# Patient Record
Sex: Male | Born: 1945 | Race: White | Hispanic: No | Marital: Married | Smoking: Never smoker
Health system: Southern US, Community
[De-identification: ages and names within clinical notes are randomized; demographics above are authoritative.]

## PROBLEM LIST (undated history)

## (undated) DIAGNOSIS — G2581 Restless legs syndrome: Secondary | ICD-10-CM

## (undated) DIAGNOSIS — L570 Actinic keratosis: Secondary | ICD-10-CM

## (undated) DIAGNOSIS — D049 Carcinoma in situ of skin, unspecified: Secondary | ICD-10-CM

## (undated) DIAGNOSIS — G629 Polyneuropathy, unspecified: Secondary | ICD-10-CM

## (undated) DIAGNOSIS — T753XXA Motion sickness, initial encounter: Secondary | ICD-10-CM

## (undated) HISTORY — DX: Carcinoma in situ of skin, unspecified: D04.9

## (undated) HISTORY — DX: Motion sickness, initial encounter: T75.3XXA

## (undated) HISTORY — DX: Actinic keratosis: L57.0

## (undated) HISTORY — PX: SKIN BIOPSY: SHX1

## (undated) HISTORY — DX: Polyneuropathy, unspecified: G62.9

## (undated) HISTORY — PX: SPINAL CORD STIMULATOR TRIAL: SHX5380

## (undated) HISTORY — PX: ARTHRODESIS: SHX136

## (undated) HISTORY — DX: Restless legs syndrome: G25.81

---

## 2014-06-11 DIAGNOSIS — Z872 Personal history of diseases of the skin and subcutaneous tissue: Secondary | ICD-10-CM | POA: Insufficient documentation

## 2014-06-11 DIAGNOSIS — L578 Other skin changes due to chronic exposure to nonionizing radiation: Secondary | ICD-10-CM | POA: Insufficient documentation

## 2014-07-03 DIAGNOSIS — Z85828 Personal history of other malignant neoplasm of skin: Secondary | ICD-10-CM | POA: Insufficient documentation

## 2016-10-14 DIAGNOSIS — G609 Hereditary and idiopathic neuropathy, unspecified: Secondary | ICD-10-CM | POA: Insufficient documentation

## 2017-05-18 DIAGNOSIS — M4802 Spinal stenosis, cervical region: Secondary | ICD-10-CM | POA: Insufficient documentation

## 2017-05-18 DIAGNOSIS — Z981 Arthrodesis status: Secondary | ICD-10-CM | POA: Insufficient documentation

## 2017-11-14 DIAGNOSIS — M25471 Effusion, right ankle: Secondary | ICD-10-CM | POA: Insufficient documentation

## 2017-11-14 DIAGNOSIS — M25472 Effusion, left ankle: Secondary | ICD-10-CM | POA: Insufficient documentation

## 2021-09-21 ENCOUNTER — Other Ambulatory Visit: Payer: Self-pay | Admitting: Internal Medicine

## 2021-09-21 DIAGNOSIS — R609 Edema, unspecified: Secondary | ICD-10-CM

## 2022-03-08 ENCOUNTER — Other Ambulatory Visit: Payer: Self-pay | Admitting: Physician Assistant

## 2022-03-08 DIAGNOSIS — G5793 Unspecified mononeuropathy of bilateral lower limbs: Secondary | ICD-10-CM

## 2022-03-18 ENCOUNTER — Ambulatory Visit
Admission: RE | Admit: 2022-03-18 | Discharge: 2022-03-18 | Disposition: A | Discharge status: Home / Self Care | Payer: Medicare PPO | Source: Ambulatory Visit | Attending: Physician Assistant | Admitting: Physician Assistant

## 2022-03-18 DIAGNOSIS — G5793 Unspecified mononeuropathy of bilateral lower limbs: Secondary | ICD-10-CM | POA: Diagnosis not present

## 2022-03-25 ENCOUNTER — Encounter (INDEPENDENT_AMBULATORY_CARE_PROVIDER_SITE_OTHER): Payer: Self-pay | Admitting: Vascular Surgery

## 2022-03-25 ENCOUNTER — Ambulatory Visit (INDEPENDENT_AMBULATORY_CARE_PROVIDER_SITE_OTHER): Payer: Medicare PPO | Admitting: Vascular Surgery

## 2022-03-25 VITALS — BP 144/77 | HR 73 | Resp 17 | Ht 70.0 in | Wt 197.0 lb

## 2022-03-25 DIAGNOSIS — G2581 Restless legs syndrome: Secondary | ICD-10-CM | POA: Insufficient documentation

## 2022-03-25 DIAGNOSIS — G609 Hereditary and idiopathic neuropathy, unspecified: Secondary | ICD-10-CM

## 2022-03-25 DIAGNOSIS — M7989 Other specified soft tissue disorders: Secondary | ICD-10-CM | POA: Diagnosis not present

## 2022-03-25 NOTE — Patient Instructions (Signed)
Edema  Edema is an abnormal buildup of fluids in the body tissues and under the skin. Swelling of the legs, feet, and ankles is a common symptom that becomes more likely as you get older. Swelling is also common in looser tissues, such as around the eyes. Pressing on the area may make a temporary dent in your skin (pitting edema). This fluid may also accumulate in your lungs (pulmonary edema). There are many possible causes of edema. Eating too much salt (sodium) and being on your feet or sitting for a long time can cause edema in your legs, feet, and ankles. Common causes of edema include: Certain medical conditions, such as heart failure, liver or kidney disease, and cancer. Weak leg blood vessels. An injury. Pregnancy. Medicines. Being obese. Low protein levels in the blood. Hot weather may make edema worse. Edema is usually painless. Your skin may look swollen or shiny. Follow these instructions at home: Medicines Take over-the-counter and prescription medicines only as told by your health care provider. Your health care provider may prescribe a medicine to help your body get rid of extra water (diuretic). Take this medicine if you are told to take it. Eating and drinking Eat a low-salt (low-sodium) diet to reduce fluid as told by your health care provider. Sometimes, eating less salt may reduce swelling. Depending on the cause of your swelling, you may need to limit how much fluid you drink (fluid restriction). General instructions Raise (elevate) the injured area above the level of your heart while you are sitting or lying down. Do not sit still or stand for long periods of time. Do not wear tight clothing. Do not wear garters on your upper legs. Exercise your legs to get your circulation going. This helps to move the fluid back into your blood vessels, and it may help the swelling go down. Wear compression stockings as told by your health care provider. These stockings help to prevent  blood clots and reduce swelling in your legs. It is important that these are the correct size. These stockings should be prescribed by your health care provider to prevent possible injuries. If elastic bandages or wraps are recommended, use them as told by your health care provider. Contact a health care provider if: Your edema does not get better with treatment. You have heart, liver, or kidney disease and have symptoms of edema. You have sudden and unexplained weight gain. Get help right away if: You develop shortness of breath or chest pain. You cannot breathe when you lie down. You develop pain, redness, or warmth in the swollen areas. You have heart, liver, or kidney disease and suddenly get edema. You have a fever and your symptoms suddenly get worse. These symptoms may be an emergency. Get help right away. Call 911. Do not wait to see if the symptoms will go away. Do not drive yourself to the hospital. Summary Edema is an abnormal buildup of fluids in the body tissues and under the skin. Eating too much salt (sodium)and being on your feet or sitting for a long time can cause edema in your legs, feet, and ankles. Raise (elevate) the injured area above the level of your heart while you are sitting or lying down. Follow your health care provider's instructions about diet and how much fluid you can drink. This information is not intended to replace advice given to you by your health care provider. Make sure you discuss any questions you have with your health care provider. Document Revised: 07/05/2021 Document   Reviewed: 07/05/2021 Elsevier Patient Education  2023 [employer] Inc.  

## 2022-03-25 NOTE — Progress Notes (Signed)
? ? ?Patient ID: Glenn Hamilton, male   DOB: 08/06/1946, 76 y.o.   MRN: [id number] ? ?Chief Complaint  ?Patient presents with  ? New Patient (Initial Visit)  ?  Consult bilateral edema  ? ? ?HPI ?Glenn Hamilton is a 76 y.o. male.  I am asked to see the patient by Dr. Lovie Macadamia for evaluation of bilateral lower extremity edema.  The patient has been having swelling for couple of years.  It started bilaterally in the right leg but now affects both legs and roughly equally.  No open wounds or infection.  No fevers or chills.  No previous history of DVT or superficial thrombophlebitis to his knowledge.  He denies any trauma, injury, or inciting event that started the symptoms.  Elevating them helps somewhat.  He has pretty significant peripheral neuropathy and has difficulty wearing compression socks due to the pressure on his legs being very uncomfortable.  He does walk but it hurts when he walks and his activity has diminished over the years.   ? ? ?Past Medical History:  ?Diagnosis Date  ? Actinic keratosis   ? Motion sickness   ? Neuropathy   ? Restless legs syndrome   ? Squamous cell carcinoma in situ (SCCIS) of skin   ? ? ? ? ? ?Family History  ?Problem Relation Age of Onset  ? Neuropathy Mother   ? Cancer Father   ? Cancer Paternal Uncle   ? ? ? ? ?Social History  ? ?Tobacco Use  ? Smoking status: Never  ? Smokeless tobacco: Never  ?Married. ?No alcohol or drug abuse ? ?Not on File ? ?Current Outpatient Medications  ?Medication Sig Dispense Refill  ? rOPINIRole (REQUIP) 1 MG tablet Take 1 tablet by mouth at bedtime.    ? ?No current facility-administered medications for this visit.  ? ? ? ? ?REVIEW OF SYSTEMS (Negative unless checked) ? ?Constitutional: [] Weight loss  [] Fever  [] Chills ?Cardiac: [] Chest pain   [] Chest pressure   [] Palpitations   [] Shortness of breath when laying flat   [] Shortness of breath at rest   [] Shortness of breath with exertion. ?Vascular:  [] Pain in legs with walking    [] Pain in legs at rest   [] Pain in legs when laying flat   [] Claudication   [] Pain in feet when walking  [] Pain in feet at rest  [] Pain in feet when laying flat   [] History of DVT   [] Phlebitis   [x] Swelling in legs   [] Varicose veins   [] Non-healing ulcers ?Pulmonary:   [] Uses home oxygen   [] Productive cough   [] Hemoptysis   [] Wheeze  [] COPD   [] Asthma ?Neurologic:  [] Dizziness  [] Blackouts   [] Seizures   [] History of stroke   [] History of TIA  [] Aphasia   [] Temporary blindness   [] Dysphagia   [] Weakness or numbness in arms   [] Weakness or numbness in legs ?Musculoskeletal:  [x] Arthritis   [] Joint swelling   [] Joint pain   [] Low back pain ?Hematologic:  [] Easy bruising  [] Easy bleeding   [] Hypercoagulable state   [] Anemic  [] Hepatitis ?Gastrointestinal:  [] Blood in stool   [] Vomiting blood  [] Gastroesophageal reflux/heartburn   [] Abdominal pain ?Genitourinary:  [] Chronic kidney disease   [] Difficult urination  [] Frequent urination  [] Burning with urination   [] Hematuria ?Skin:  [] Rashes   [] Ulcers   [] Wounds ?Psychological:  [] History of anxiety   []  History of major depression. ? ? ? ?Physical Exam ?BP (!) 144/77 (BP Location: Left Arm)   Pulse 73  Resp 17   Ht 5\' 10"  (1.778 m)   Wt 197 lb (89.4 kg)   BMI 28.27 kg/m?  ?Gen:  WD/WN, NAD.  Appears younger than stated age ?Head: [place]/AT, No temporalis wasting.  ?Ear/Nose/Throat: Hearing grossly intact, nares w/o erythema or drainage, oropharynx w/o Erythema/Exudate ?Eyes: Conjunctiva clear, sclera non-icteric  ?Neck: trachea midline.  No JVD.  ?Pulmonary:  Good air movement, respirations not labored, no use of accessory muscles  ?Cardiac: RRR, no JVD ?Vascular:  ?Vessel Right Left  ?Radial Palpable Palpable  ?    ?    ?    ?    ?    ?    ?DP 1+ 1+  ?PT 1+ 1+  ? ?Gastrointestinal:. No masses, surgical incisions, or scars. ?Musculoskeletal: M/S 5/5 throughout.  Extremities without ischemic changes.  No deformity or atrophy.  1-2+ bilateral lower extremity  edema. ?Neurologic: Sensation grossly intact in extremities.  Symmetrical.  Speech is fluent. Motor exam as listed above. ?Psychiatric: Judgment intact, Mood & affect appropriate for pt's clinical situation. ?Dermatologic: No rashes or ulcers noted.  No cellulitis or open wounds. ? ? ? ?Radiology ?MR THORACIC SPINE WO CONTRAST ? ?Result Date: 03/18/2022 ?CLINICAL DATA:  Burning sensation in both legs, pre spinal stimulator imaging EXAM: MRI THORACIC SPINE WITHOUT CONTRAST TECHNIQUE: Multiplanar, multisequence MR imaging of the thoracic spine was performed. No intravenous contrast was administered. COMPARISON:  None Available. FINDINGS: Alignment: Thoracic spine alignment is normal. There is transitional lumbosacral anatomy with sacralization of the L5 vertebral body seen on the scout sequence. There is grade 1 anterolisthesis of L2 on L3. Vertebrae: Postsurgical changes are noted in the cervical spine on the count sequence, incompletely evaluated. Thoracic vertebral body heights are preserved. Background marrow signal is normal. 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 There is no suspicious marrow signal abnormality in the spine. There is trace degenerative endplate marrow signal abnormality with faint edema at T9-T10. There is no other marrow edema. There is a 1.7 cm T2/STIR hyperintense lesion in the sternal manubrium with corresponding mild T1 hypointensity. Cord: The lower cervical cord appears atrophic with decreased AP diameter. The thoracic cord is normal in signal and morphology. Paraspinal and other soft tissues: Unremarkable. Disc levels: There is mild multilevel degenerative endplate change and facet arthropathy. There is disc desiccation and narrowing most advanced at T7-T8 through T9-T10. There is a mild disc bulge at C7-T1 without significant spinal canal or neural foraminal stenosis There are small disc protrusions at T7-T8 and T8-T9 without  significant spinal. There is a mild bulge at T9-T10 extending into the left neural foramen, facet arthropathy, and ligamentum flavum thickening resulting in mild spinal canal stenosis and severe left neural foraminal stenosis. There is no other significant spinal canal or neural foraminal stenosis. IMPRESSION: 1. Disc bulge at T9-T10 extending into the left neural foramen and facet arthropathy resulting in severe left neural foraminal stenosis and mild spinal canal stenosis. 2. Otherwise, mild degenerative changes throughout the remainder of the thoracic spine without other significant spinal canal or neural foraminal stenosis. 3. T2 hyperintense, T1 hypointense lesion in the sternal manubrium. 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 Correlation with patient's prior CT chest from 04/30/2018 (not available for direct comparison at the time of dictation) for typical CT features of an intraosseous hemangioma would  be useful to establish stability. Alternatively, consider follow-up MRI with contrast in 4-6 months. 4. Apparent atrophy of the lower cervical cord, incompletely imaged. Electronically Signed   By: Valetta Mole M.D.   On: 03/18/2022 12:39   ? ?Labs ?No results found for this or any previous visit (from the past 2160 hour(s)). ? ?Assessment/Plan: ? ?Swelling of limb ?Recommend: ? ?I have had a long discussion with the patient regarding swelling and why it  causes symptoms.  Patient will begin wearing graduated compression on a daily basis a prescription was given. The patient will  wear the stockings first thing in the morning and removing them in the evening. The patient is instructed specifically not to sleep in the stockings.  ? ?In addition, behavioral modification will be initiated.  This will include frequent elevation, use of over the counter  pain medications and exercise such as walking. ? ?Consideration for a lymph pump will also be made based upon the effectiveness of conservative therapy.  This would help to improve the edema control and prevent sequela such

## 2022-03-25 NOTE — Assessment & Plan Note (Signed)
His neuropathy has limited his mobility and made it harder for him to wear compression socks as well. ?

## 2022-03-25 NOTE — Assessment & Plan Note (Signed)

## 2022-04-05 ENCOUNTER — Telehealth (INDEPENDENT_AMBULATORY_CARE_PROVIDER_SITE_OTHER): Payer: Self-pay | Admitting: Vascular Surgery

## 2022-04-05 NOTE — Telephone Encounter (Signed)
Patient's wife called and stated patient has an appointment on the 04/26/22 and would like to get an earlier appointment due to his foot turning purple. I looked and there is no earlier ultrasound appointments that I can give him.  Please advise.

## 2022-04-05 NOTE — Telephone Encounter (Signed)
Called to let pt's wife know that Dr Wyn Quaker said that we could wait until the 13th of June for pt's ultrasound as he feels the purple color is coming from the pt's neuropathy. Pt's wife states that multiple neurologist have told them that the swelling was not from neuropathy.  Pt's feet are getting redder and more purple by the day.  She also stated "I guess we wait for his feet to turn darker until they are black and go to the ER".  I did tell her that if the pt had significant changes or other symptoms he should see his PCP or go to the ED.  Wife continues to say that she is not disputing my Dr's word but to her the changes are significant.  I let her know I would pass along the information she has provided and someone would reach back out to her with any changes in recommendations.

## 2022-04-13 ENCOUNTER — Encounter (INDEPENDENT_AMBULATORY_CARE_PROVIDER_SITE_OTHER): Payer: Self-pay | Admitting: Vascular Surgery

## 2022-04-26 ENCOUNTER — Encounter (INDEPENDENT_AMBULATORY_CARE_PROVIDER_SITE_OTHER): Payer: Self-pay | Admitting: Vascular Surgery

## 2022-04-26 ENCOUNTER — Ambulatory Visit (INDEPENDENT_AMBULATORY_CARE_PROVIDER_SITE_OTHER): Payer: Medicare PPO

## 2022-04-26 ENCOUNTER — Ambulatory Visit (INDEPENDENT_AMBULATORY_CARE_PROVIDER_SITE_OTHER): Payer: Medicare PPO | Admitting: Vascular Surgery

## 2022-04-26 VITALS — BP 133/74 | HR 59 | Resp 14 | Ht 70.0 in | Wt 198.0 lb

## 2022-04-26 DIAGNOSIS — G609 Hereditary and idiopathic neuropathy, unspecified: Secondary | ICD-10-CM | POA: Diagnosis not present

## 2022-04-26 DIAGNOSIS — I890 Lymphedema, not elsewhere classified: Secondary | ICD-10-CM | POA: Insufficient documentation

## 2022-04-26 DIAGNOSIS — M7989 Other specified soft tissue disorders: Secondary | ICD-10-CM

## 2022-04-26 NOTE — Progress Notes (Signed)
MRN : [id number]  Glenn Hamilton is a 76 y.o. ([birth year]) male who presents with chief complaint of  Chief Complaint  Patient presents with   Follow-up    ultrasound  .  History of Present Illness: Patient returns today in follow up of his leg swelling.  His symptoms are basically unchanged from his last visit last month.  Both legs remain swollen.  His neuropathy has precluded him from being able to reliably wear compression socks.  A venous reflux study was performed today showing no evidence of deep venous thrombosis, superficial thrombophlebitis, or significant venous reflux in either lower extremity.  Current Outpatient Medications  Medication Sig Dispense Refill   b complex vitamins capsule Take 1 capsule by mouth daily.     rOPINIRole (REQUIP) 1 MG tablet Take 1 tablet by mouth at bedtime.     TURMERIC CURCUMIN PO Take by mouth.     Vitamin E (VITAMIN E/D-ALPHA NATURAL) 268 MG (400 UNIT) CAPS Take by mouth.     No current facility-administered medications for this visit.    Past Medical History:  Diagnosis Date   Actinic keratosis    Motion sickness    Neuropathy    Restless legs syndrome    Squamous cell carcinoma in situ (SCCIS) of skin       Social History   Tobacco Use   Smoking status: Never   Smokeless tobacco: Never      Family History  Problem Relation Age of Onset   Neuropathy Mother    Cancer Father    Cancer Paternal Uncle      Not on File   REVIEW OF SYSTEMS (Negative unless checked)   Constitutional: [] Weight loss  [] Fever  [] Chills Cardiac: [] Chest pain   [] Chest pressure   [] Palpitations   [] Shortness of breath when laying flat   [] Shortness of breath at rest   [] Shortness of breath with exertion. Vascular:  [] Pain in legs with walking   [] Pain in legs at rest   [] Pain in legs when laying flat   [] Claudication   [] Pain in feet when walking  [] Pain in feet at rest  [] Pain in feet when laying flat   [] History of DVT    [] Phlebitis   [x] Swelling in legs   [] Varicose veins   [] Non-healing ulcers Pulmonary:   [] Uses home oxygen   [] Productive cough   [] Hemoptysis   [] Wheeze  [] COPD   [] Asthma Neurologic:  [] Dizziness  [] Blackouts   [] Seizures   [] History of stroke   [] History of TIA  [] Aphasia   [] Temporary blindness   [] Dysphagia   [] Weakness or numbness in arms   [] Weakness or numbness in legs Musculoskeletal:  [x] Arthritis   [] Joint swelling   [] Joint pain   [] Low back pain Hematologic:  [] Easy bruising  [] Easy bleeding   [] Hypercoagulable state   [] Anemic  [] Hepatitis Gastrointestinal:  [] Blood in stool   [] Vomiting blood  [] Gastroesophageal reflux/heartburn   [] Abdominal pain Genitourinary:  [] Chronic kidney disease   [] Difficult urination  [] Frequent urination  [] Burning with urination   [] Hematuria Skin:  [] Rashes   [] Ulcers   [] Wounds Psychological:  [] History of anxiety   []  History of major depression.   Physical Examination  BP 133/74 (BP Location: Left Arm)   Pulse (!) 59   Resp 14   Ht 5\' 10"  (1.778 m)   Wt 198 lb (89.8 kg)   BMI 28.41 kg/m  Gen:  WD/WN, NAD Head: [place]/AT, No temporalis wasting. Ear/Nose/Throat: Hearing grossly intact,  nares w/o erythema or drainage Eyes: Conjunctiva clear. Sclera non-icteric Neck: Supple.  Trachea midline Pulmonary:  Good air movement, no use of accessory muscles.  Cardiac: RRR, no JVD Vascular:  Vessel Right Left  Radial Palpable Palpable           Musculoskeletal: M/S 5/5 throughout.  No deformity or atrophy. 1-2+ BLE edema. Neurologic: Sensation grossly intact in extremities.  Symmetrical.  Speech is fluent.  Psychiatric: Judgment intact, Mood & affect appropriate for pt's clinical situation. Dermatologic: No rashes or ulcers noted.  No cellulitis or open wounds.      Labs No results found for this or any previous visit (from the past 2160 hour(s)).  Radiology No results found.  Assessment/Plan  Lymphedema The patient has stage II  lymphedema with leg swelling refractory to elevation and exercise.  He has tried compression socks but his severe neuropathy will not allow him to wear these reliably.  His venous reflux study was normal.  He has had a negative cardiac evaluation.  He and his wife said they are going to consider being evaluated by nephrologist as well which is reasonable.  I think a lymphedema pump would be a good therapy for him.  He has had something similar to that and it sounds like he was able to tolerate it with his neuropathy so I think we should give that a try.  We will plan on seeing him back in about 6 months to assess his response to therapy.  Hereditary and idiopathic neuropathy, unspecified This keeps him from using compression socks.  May also be contributing to his swelling.    Festus Barren, MD  04/26/2022 11:31 AM    This note was created with Dragon medical transcription system.  Any errors from dictation are purely unintentional

## 2022-04-26 NOTE — Assessment & Plan Note (Signed)
The patient has stage II lymphedema with leg swelling refractory to elevation and exercise.  He has tried compression socks but his severe neuropathy will not allow him to wear these reliably.  His venous reflux study was normal.  He has had a negative cardiac evaluation.  He and his wife said they are going to consider being evaluated by nephrologist as well which is reasonable.  I think a lymphedema pump would be a good therapy for him.  He has had something similar to that and it sounds like he was able to tolerate it with his neuropathy so I think we should give that a try.  We will plan on seeing him back in about 6 months to assess his response to therapy.

## 2022-04-26 NOTE — Assessment & Plan Note (Signed)
This keeps him from using compression socks.  May also be contributing to his swelling.

## 2022-09-12 ENCOUNTER — Encounter (INDEPENDENT_AMBULATORY_CARE_PROVIDER_SITE_OTHER): Payer: Self-pay

## 2023-05-09 ENCOUNTER — Encounter: Payer: Self-pay | Admitting: *Deleted

## 2023-05-16 ENCOUNTER — Ambulatory Visit: Admission: RE | Admit: 2023-05-16 | Payer: Medicare PPO | Source: Home / Self Care

## 2023-05-16 ENCOUNTER — Encounter: Admission: RE | Payer: Self-pay | Source: Home / Self Care

## 2023-05-16 SURGERY — COLONOSCOPY WITH PROPOFOL
Anesthesia: General

## 2023-07-31 ENCOUNTER — Other Ambulatory Visit: Payer: Self-pay

## 2023-08-04 ENCOUNTER — Encounter: Payer: Self-pay | Admitting: *Deleted

## 2023-08-07 ENCOUNTER — Ambulatory Visit
Admission: RE | Admit: 2023-08-07 | Discharge: 2023-08-07 | Disposition: A | Discharge status: Home / Self Care | Payer: Medicare PPO | Attending: Gastroenterology | Admitting: Gastroenterology

## 2023-08-07 ENCOUNTER — Other Ambulatory Visit: Payer: Self-pay

## 2023-08-07 ENCOUNTER — Ambulatory Visit: Payer: Medicare PPO | Admitting: Anesthesiology

## 2023-08-07 ENCOUNTER — Encounter: Payer: Self-pay | Admitting: *Deleted

## 2023-08-07 ENCOUNTER — Encounter
Admission: RE | Disposition: A | Discharge status: Home / Self Care | Payer: Self-pay | Source: Home / Self Care | Attending: Gastroenterology

## 2023-08-07 DIAGNOSIS — D122 Benign neoplasm of ascending colon: Secondary | ICD-10-CM | POA: Diagnosis not present

## 2023-08-07 DIAGNOSIS — Z86010 Personal history of colonic polyps: Secondary | ICD-10-CM | POA: Diagnosis not present

## 2023-08-07 DIAGNOSIS — Z1211 Encounter for screening for malignant neoplasm of colon: Secondary | ICD-10-CM | POA: Insufficient documentation

## 2023-08-07 DIAGNOSIS — K640 First degree hemorrhoids: Secondary | ICD-10-CM | POA: Insufficient documentation

## 2023-08-07 DIAGNOSIS — G629 Polyneuropathy, unspecified: Secondary | ICD-10-CM | POA: Diagnosis not present

## 2023-08-07 HISTORY — PX: POLYPECTOMY: SHX5525

## 2023-08-07 HISTORY — PX: COLONOSCOPY WITH PROPOFOL: SHX5780

## 2023-08-07 SURGERY — COLONOSCOPY WITH PROPOFOL
Anesthesia: General

## 2023-08-07 MED ORDER — SODIUM CHLORIDE 0.9 % IV SOLN: INTRAVENOUS | Status: DC

## 2023-08-07 MED ORDER — PROPOFOL 10 MG/ML IV BOLUS
INTRAVENOUS | Status: DC | PRN
  Administered 2023-08-07: 80 mg via INTRAVENOUS
  Administered 2023-08-07: 20 mg via INTRAVENOUS

## 2023-08-07 MED ORDER — PROPOFOL 1000 MG/100ML IV EMUL
INTRAVENOUS | Status: AC
  Filled 2023-08-07: qty 100

## 2023-08-07 MED ORDER — LIDOCAINE HCL (CARDIAC) PF 100 MG/5ML IV SOSY
PREFILLED_SYRINGE | INTRAVENOUS | Status: DC | PRN
  Administered 2023-08-07: 50 mg via INTRAVENOUS

## 2023-08-07 MED ORDER — PROPOFOL 500 MG/50ML IV EMUL
INTRAVENOUS | Status: DC | PRN
  Administered 2023-08-07: 150 ug/kg/min via INTRAVENOUS

## 2023-08-07 MED ORDER — LIDOCAINE HCL (PF) 2 % IJ SOLN
INTRAMUSCULAR | Status: AC
  Filled 2023-08-07: qty 5

## 2023-08-07 NOTE — Transfer of Care (Signed)
Immediate Anesthesia Transfer of Care Note  Patient: Glenn Hamilton  Procedure(s) Performed: COLONOSCOPY WITH PROPOFOL POLYPECTOMY  Patient Location: PACU and Endoscopy Unit  Anesthesia Type:General  Level of Consciousness: awake  Airway & Oxygen Therapy: Patient Spontanous Breathing  Post-op Assessment: Report given to RN  Post vital signs: stable  Last Vitals:  Vitals Value Taken Time  BP 109/78 08/07/23 1050  Temp    Pulse 74 08/07/23 1050  Resp 13 08/07/23 1050  SpO2 97 % 08/07/23 1050    Last Pain:  Vitals:   08/07/23 1050  TempSrc:   PainSc: Asleep     Past Medical History:  Diagnosis Date   Actinic keratosis    Motion sickness    Neuropathy    Restless legs syndrome    Squamous cell carcinoma in situ (SCCIS) of skin    Past Surgical History:  Procedure Laterality Date   ARTHRODESIS     cervicle spine   SKIN BIOPSY     SKIN BIOPSY     SPINAL CORD STIMULATOR TRIAL     Scheduled Meds: Continuous Infusions:  sodium chloride 20 mL/hr at 08/07/23 1023   PRN Meds:.     Complications: No notable events documented.

## 2023-08-07 NOTE — Interval H&P Note (Signed)
History and Physical Interval Note:  08/07/2023 10:15 AM  Glenn Hamilton  has presented today for surgery, with the diagnosis of h/o polyps.  The various methods of treatment have been discussed with the patient and family. After consideration of risks, benefits and other options for treatment, the patient has consented to  Procedure(s): COLONOSCOPY WITH PROPOFOL (N/A) as a surgical intervention.  The patient's history has been reviewed, patient examined, no change in status, stable for surgery.  I have reviewed the patient's chart and labs.  Questions were answered to the patient's satisfaction.     [name]  Ok to proceed with colonoscopy

## 2023-08-07 NOTE — H&P (Signed)
Outpatient short stay form Pre-procedure 08/07/2023  [name], MD  Primary Physician: [name], MD  Reason for visit:  Surveillance  History of present illness:    77 y/o gentleman with history of peripheral neuropathy and cervical spine surgeries here for colonoscopy due to history of TA on last colonoscopy in 2019. No blood thinners. No family history of GI malignancies. No significant abdominal surgeries.    Current Facility-Administered Medications:    0.9 %  sodium chloride infusion, , Intravenous, Continuous, Lennis Rader, Rossie Muskrat, MD  Medications Prior to Admission  Medication Sig Dispense Refill Last Dose   rOPINIRole (REQUIP) 1 MG tablet Take 1 tablet by mouth at bedtime.   08/06/2023   Alpha-Lipoic Acid 600 MG CAPS Take by mouth 2 (two) times daily. (Patient not taking: Reported on 07/31/2023)   Not Taking   b complex vitamins capsule Take 1 capsule by mouth daily. (Patient not taking: Reported on 07/31/2023)   Not Taking   DULoxetine (CYMBALTA) 30 MG capsule Take 30 mg by mouth daily. (Patient not taking: Reported on 07/31/2023)   Not Taking   TURMERIC CURCUMIN PO Take by mouth. (Patient not taking: Reported on 07/31/2023)   Not Taking   Vitamin E (VITAMIN E/D-ALPHA NATURAL) 268 MG (400 UNIT) CAPS Take by mouth. (Patient not taking: Reported on 07/31/2023)   Not Taking     No Known Allergies   Past Medical History:  Diagnosis Date   Actinic keratosis    Motion sickness    Neuropathy    Restless legs syndrome    Squamous cell carcinoma in situ (SCCIS) of skin     Review of systems:  Otherwise negative.    Physical Exam  Gen: Alert, oriented. Appears stated age.  HEENT: PERRLA. Lungs: No respiratory distress CV: RRR Abd: soft, benign, no masses Ext: No edema    Planned procedures: Proceed with colonoscopy. The patient understands the nature of the planned procedure, indications, risks, alternatives and potential complications including but not  limited to bleeding, infection, perforation, damage to internal organs and possible oversedation/side effects from anesthesia. The patient agrees and gives consent to proceed.  Please refer to procedure notes for findings, recommendations and patient disposition/instructions.     [name], MD [facility]

## 2023-08-07 NOTE — Anesthesia Postprocedure Evaluation (Signed)
Anesthesia Post Note  Patient: Glenn Hamilton  Procedure(s) Performed: COLONOSCOPY WITH PROPOFOL POLYPECTOMY  Patient location during evaluation: PACU Anesthesia Type: General Level of consciousness: awake and alert, oriented and patient cooperative Pain management: pain level controlled Vital Signs Assessment: post-procedure vital signs reviewed and stable Respiratory status: spontaneous breathing, nonlabored ventilation and respiratory function stable Cardiovascular status: blood pressure returned to baseline and stable Postop Assessment: adequate PO intake Anesthetic complications: no   No notable events documented.   Last Vitals:  Vitals:   08/07/23 1100 08/07/23 1112  BP: 121/67 133/76  Pulse: 68 66  Resp: 15 14  Temp:    SpO2: 100% 100%    Last Pain:  Vitals:   08/07/23 1112  TempSrc:   PainSc: 0-No pain                 Reed Breech

## 2023-08-07 NOTE — Op Note (Signed)
[facility] Gastroenterology Patient Name: Glenn Hamilton Procedure Date: 08/07/2023 10:10 AM MRN: [id number] Account #: [id number] Date of Birth: Mar 22, 1946 Admit Type: Outpatient Age: 77 Room: [facility] ENDO ROOM 3 Gender: Male Note Status: Finalized Instrument Name: Nelda Marseille [id number] Procedure:             Colonoscopy Indications:           Surveillance: Personal history of adenomatous polyps                         on last colonoscopy 5 years ago Providers:             Eather Colas MD, MD Referring MD:          Teena Irani. Terance Hart, MD (Referring MD) Medicines:             Monitored Anesthesia Care Complications:         No immediate complications. Estimated blood loss:                         Minimal. Procedure:             Pre-Anesthesia Assessment:                        - Prior to the procedure, a History and Physical was                         performed, and patient medications and allergies were                         reviewed. The patient is competent. The risks and                         benefits of the procedure and the sedation options and                         risks were discussed with the patient. All questions                         were answered and informed consent was obtained.                         Patient identification and proposed procedure were                         verified by the physician, the nurse, the                         anesthesiologist, the anesthetist and the technician                         in the endoscopy suite. Mental Status Examination:                         alert and oriented. Airway Examination: normal                         oropharyngeal airway and neck mobility. Respiratory  Examination: clear to auscultation. CV Examination:                         normal. Prophylactic Antibiotics: The patient does not                         require prophylactic antibiotics. Prior                          Anticoagulants: The patient has taken no anticoagulant                         or antiplatelet agents. ASA Grade Assessment: II - A                         patient with mild systemic disease. After reviewing                         the risks and benefits, the patient was deemed in                         satisfactory condition to undergo the procedure. The                         anesthesia plan was to use monitored anesthesia care                         (MAC). Immediately prior to administration of                         medications, the patient was re-assessed for adequacy                         to receive sedatives. The heart rate, respiratory                         rate, oxygen saturations, blood pressure, adequacy of                         pulmonary ventilation, and response to care were                         monitored throughout the procedure. The physical                         status of the patient was re-assessed after the                         procedure.                        After obtaining informed consent, the colonoscope was                         passed under direct vision. Throughout the procedure,                         the patient's blood pressure, pulse, and oxygen  saturations were monitored continuously. The                         Colonoscope was introduced through the anus and                         advanced to the the cecum, identified by appendiceal                         orifice and ileocecal valve. The colonoscopy was                         somewhat difficult due to significant looping.                         Successful completion of the procedure was aided by                         applying abdominal pressure. The patient tolerated the                         procedure well. The quality of the bowel preparation                         was excellent. The ileocecal valve, appendiceal                         orifice,  and rectum were photographed. Findings:      The perianal and digital rectal examinations were normal.      A 3 mm polyp was found in the ascending colon. The polyp was sessile.       The polyp was removed with a cold snare. Resection and retrieval were       complete. Estimated blood loss was minimal.      Internal hemorrhoids were found during retroflexion. The hemorrhoids       were Grade I (internal hemorrhoids that do not prolapse).      The exam was otherwise without abnormality on direct and retroflexion       views. Impression:            - One 3 mm polyp in the ascending colon, removed with                         a cold snare. Resected and retrieved.                        - Internal hemorrhoids.                        - The examination was otherwise normal on direct and                         retroflexion views. Recommendation:        - Discharge patient to home.                        - Resume previous diet.                        - Continue present  medications.                        - Await pathology results.                        - Repeat colonoscopy is not recommended due to current                         age (68 years or older) for surveillance.                        - Return to referring physician as previously                         scheduled. Procedure Code(s):     --- Professional ---                        [phone], Colonoscopy, flexible; with removal of                         tumor(s), polyp(s), or other lesion(s) by snare                         technique Diagnosis Code(s):     --- Professional ---                        Z86.010, Personal history of colonic polyps                        D12.2, Benign neoplasm of ascending colon                        K64.0, First degree hemorrhoids CPT copyright 2022 [employer]. All rights reserved. The codes documented in this report are preliminary and upon coder review may  be revised to meet current  compliance requirements. Eather Colas MD, MD 08/07/2023 10:48:29 AM Number of Addenda: 0 Note Initiated On: 08/07/2023 10:10 AM Scope Withdrawal Time: 0 hours 7 minutes 39 seconds  Total Procedure Duration: 0 hours 15 minutes 1 second  Estimated Blood Loss:  Estimated blood loss was minimal.      [facility]

## 2023-08-07 NOTE — Anesthesia Preprocedure Evaluation (Addendum)
Anesthesia Evaluation  Patient identified by MRN, date of birth, ID band Patient awake    Reviewed: Allergy & Precautions, NPO status , Patient's Chart, lab work & pertinent test results  History of Anesthesia Complications Negative for: history of anesthetic complications  Airway Mallampati: II   Neck ROM: Full    Dental  (+) Missing   Pulmonary neg pulmonary ROS   Pulmonary exam normal breath sounds clear to auscultation       Cardiovascular Exercise Tolerance: Good negative cardio ROS Normal cardiovascular exam Rhythm:Regular Rate:Normal     Neuro/Psych  Neuromuscular disease (neuropathy)    GI/Hepatic negative GI ROS,,,  Endo/Other  negative endocrine ROS    Renal/GU negative Renal ROS     Musculoskeletal  (+) Arthritis ,  Skin SCC   Abdominal   Peds  Hematology negative hematology ROS (+)   Anesthesia Other Findings   Reproductive/Obstetrics                             Anesthesia Physical Anesthesia Plan  ASA: 2  Anesthesia Plan: General   Post-op Pain Management:    Induction: Intravenous  PONV Risk Score and Plan: 2 and Propofol infusion, TIVA and Treatment may vary due to age or medical condition  Airway Management Planned: Natural Airway  Additional Equipment:   Intra-op Plan:   Post-operative Plan:   Informed Consent: I have reviewed the patients History and Physical, chart, labs and discussed the procedure including the risks, benefits and alternatives for the proposed anesthesia with the patient or authorized representative who has indicated his/her understanding and acceptance.       Plan Discussed with: CRNA  Anesthesia Plan Comments: (LMA/GETA backup discussed.  Patient consented for risks of anesthesia including but not limited to:  - adverse reactions to medications - damage to eyes, teeth, lips or other oral mucosa - nerve damage due to  positioning  - sore throat or hoarseness - damage to heart, brain, nerves, lungs, other parts of body or loss of life  Informed patient about role of CRNA in peri- and intra-operative care.  Patient voiced understanding.)       Anesthesia Quick Evaluation

## 2023-08-08 ENCOUNTER — Encounter: Payer: Self-pay | Admitting: Gastroenterology

## 2023-08-10 LAB — SURGICAL PATHOLOGY

## 2023-12-28 IMAGING — MR MR THORACIC SPINE W/O CM
5 of 6 series · 30 of 48 positions shown · non-contrast
Comparison: None Available.

CLINICAL DATA: Burning sensation in both legs, pre spinal
stimulator imaging

EXAM:
MRI THORACIC SPINE WITHOUT CONTRAST
TECHNIQUE: Multiplanar, multisequence MR imaging of the thoracic spine was
performed. No intravenous contrast was administered.

[Series 18: T1 · sagittal · 6.0mm · 1.41mm/px · 4 of 9 slices shown (1 of 2)]
[im 1/9]
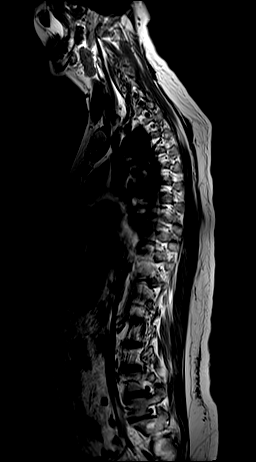
[im 3/9]
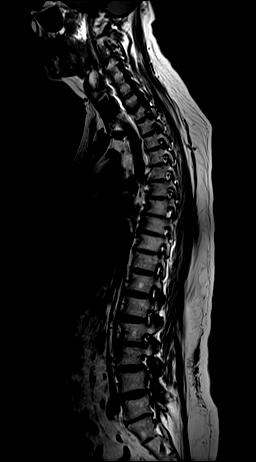
[im 6/9]
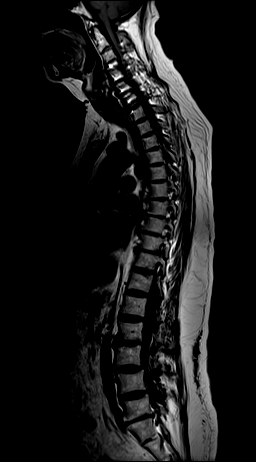
[im 9/9]
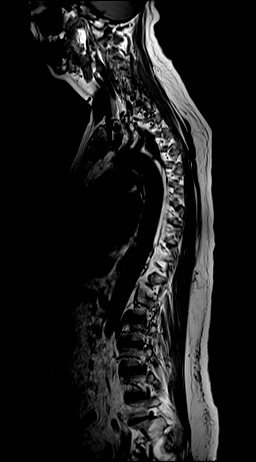

[Series 19: T2 · sagittal · 3.0mm · 1.06mm/px · 6 of 19 slices shown (1 of 2)]
[im 1/19]
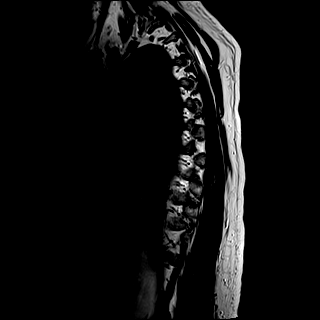
[im 4/19]
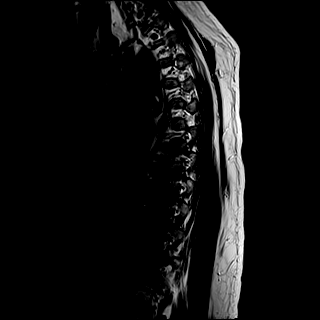
[im 8/19]
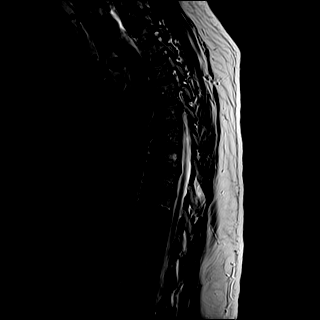
[im 11/19]
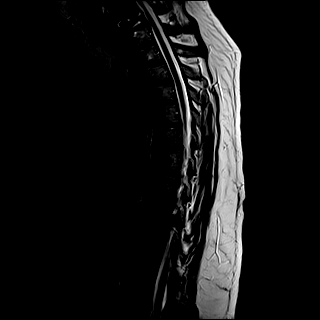
[im 15/19]
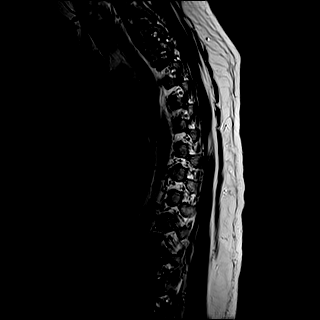
[im 19/19]
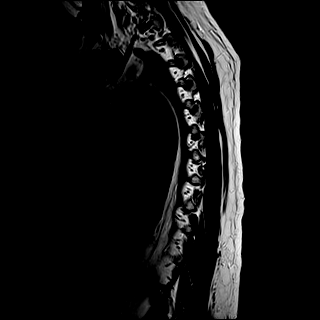

[Series 20: T1 · sagittal · 3.0mm · 1.06mm/px · 6 of 19 slices shown (2 of 2)]
[im 1/19]
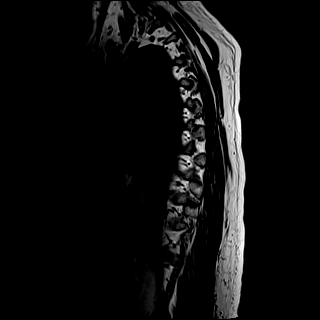
[im 4/19]
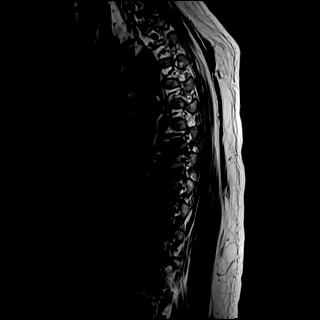
[im 8/19]
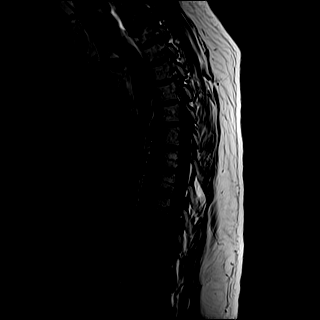
[im 11/19]
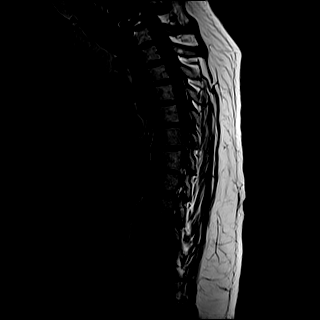
[im 15/19]
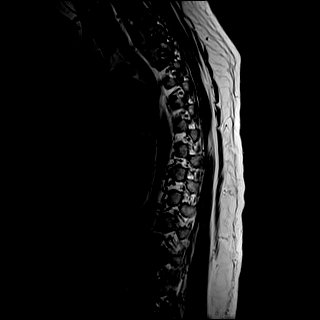
[im 19/19]
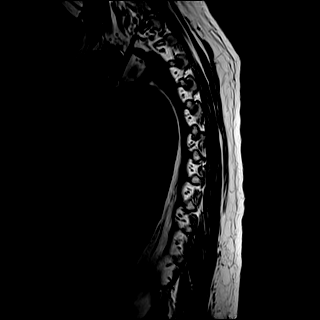

[Series 21: STIR · sagittal · 3.0mm · 0.53mm/px · 5 of 19 slices shown]
[im 1/19]
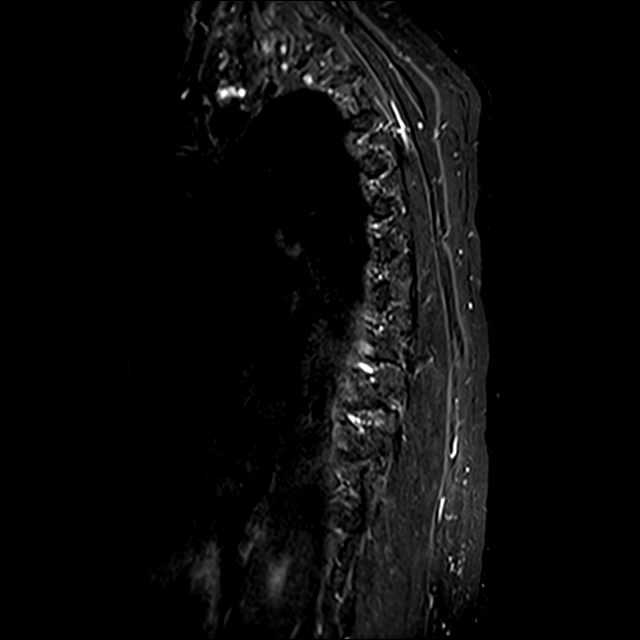
[im 4/19]
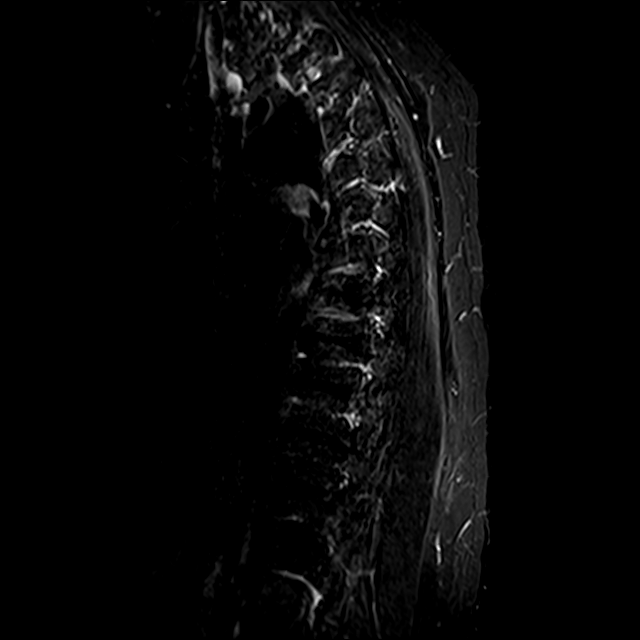
[im 8/19]
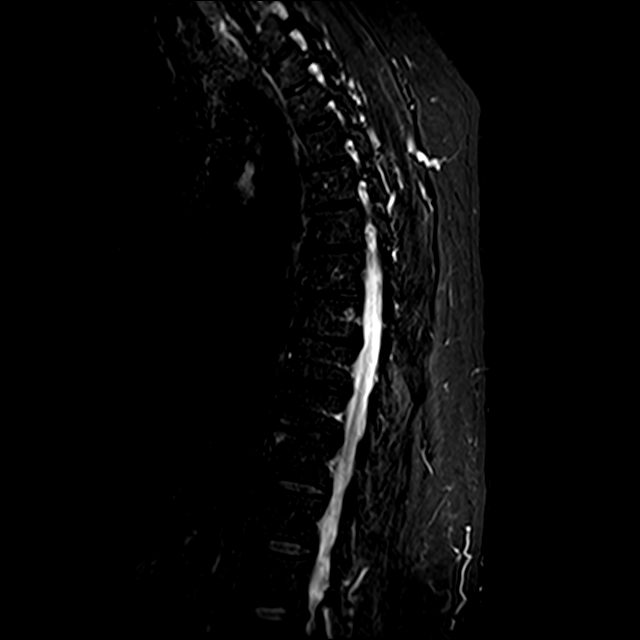
[im 11/19]
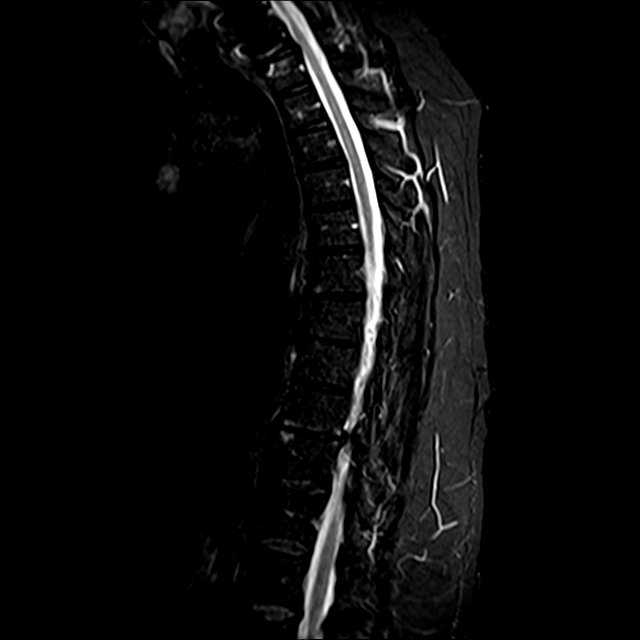
[im 15/19]
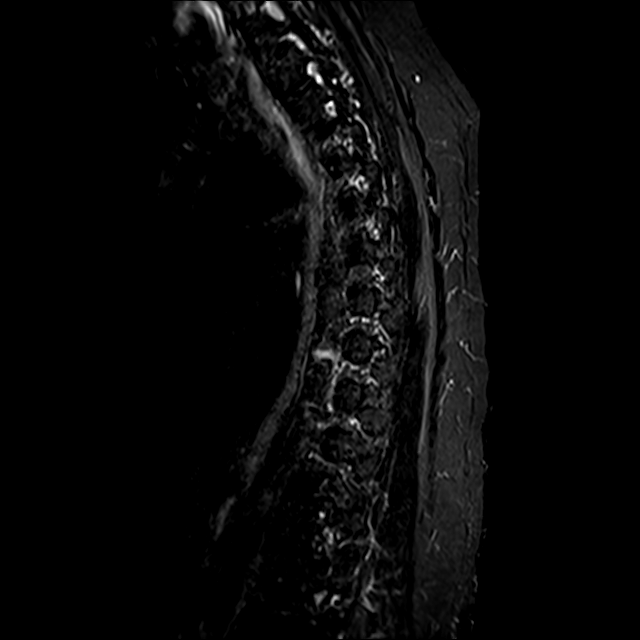

[Series 22: T2 · axial · 4.0mm · 0.59mm/px · z∈[-312,-73]mm · 9 of 39 slices shown (2 of 2)]
[im 1/39]
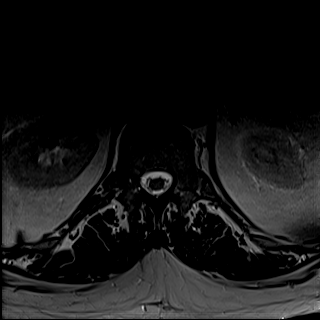
[im 7/39]
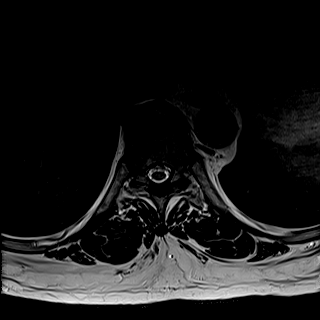
[im 13/39]
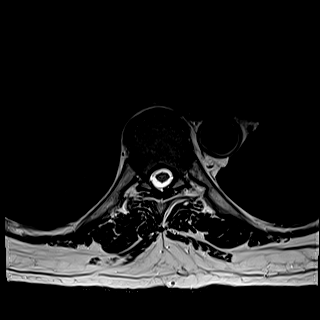
[im 16/39]
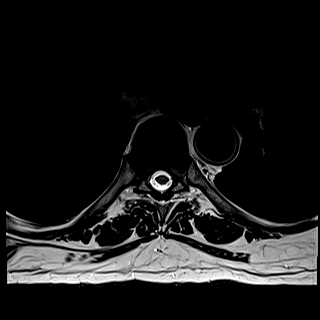
[im 20/39]
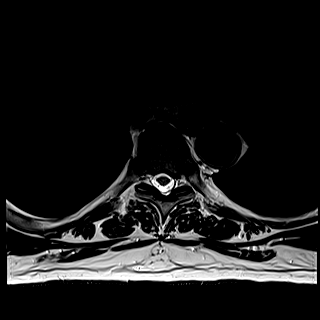
[im 23/39]
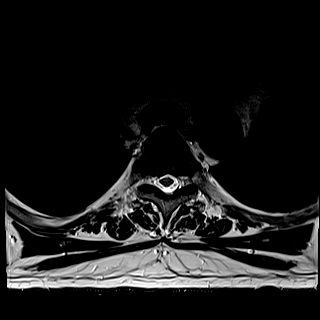
[im 26/39]
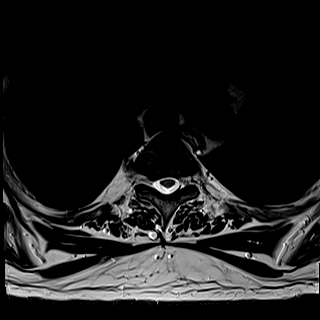
[im 32/39]
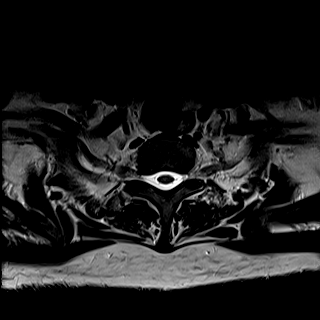
[im 39/39]
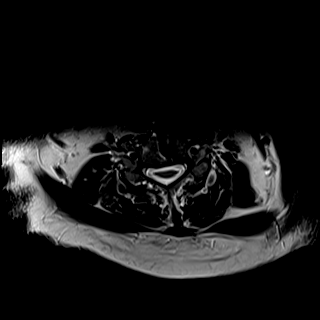

[30 of 48 positions shown; findings below may reference images not displayed]

FINDINGS: Alignment: Thoracic spine alignment is normal. There is transitional
lumbosacral anatomy with sacralization of the L5 vertebral body seen
on the scout sequence. There is grade 1 anterolisthesis of L2 on L3.

Vertebrae: Postsurgical changes are noted in the cervical spine on
the count sequence, incompletely evaluated. Thoracic vertebral body
heights are preserved. Background marrow signal is normal. There are
small probable benign intraosseous hemangiomas in the T7 and T11
vertebral bodies. There is no suspicious marrow signal abnormality
in the spine. There is trace degenerative endplate marrow signal
abnormality with faint edema at T9-T10. There is no other marrow
edema.

There is a 1.7 cm T2/STIR hyperintense lesion in the sternal
manubrium with corresponding mild T1 hypointensity.

Cord: The lower cervical cord appears atrophic with decreased AP
diameter. The thoracic cord is normal in signal and morphology.

Paraspinal and other soft tissues: Unremarkable.

Disc levels:

There is mild multilevel degenerative endplate change and facet
arthropathy. There is disc desiccation and narrowing most advanced
at T7-T8 through T9-T10.

There is a mild disc bulge at C7-T1 without significant spinal canal
or neural foraminal stenosis

There are small disc protrusions at T7-T8 and T8-T9 without
significant spinal.

There is a mild bulge at T9-T10 extending into the left neural
foramen, facet arthropathy, and ligamentum flavum thickening
resulting in mild spinal canal stenosis and severe left neural
foraminal stenosis.

There is no other significant spinal canal or neural foraminal
stenosis.
IMPRESSION: 1. Disc bulge at T9-T10 extending into the left neural foramen and
facet arthropathy resulting in severe left neural foraminal stenosis
and mild spinal canal stenosis.
2. Otherwise, mild degenerative changes throughout the remainder of
the thoracic spine without other significant spinal canal or neural
foraminal stenosis.
3. T2 hyperintense, T1 hypointense lesion in the sternal manubrium.
This is favored to reflect an atypical hemangioma given more typical
appearing hemangiomas in the T7 and T11 vertebral bodies, though a
metastatic lesion cannot be entirely excluded based on this study.
Correlation with patient's prior CT chest from 04/30/2018 (not
available for direct comparison at the time of dictation) for
typical CT features of an intraosseous hemangioma would be useful to
establish stability. Alternatively, consider follow-up MRI with
contrast in 4-6 months.
4. Apparent atrophy of the lower cervical cord, incompletely imaged.

## 2024-08-08 ENCOUNTER — Telehealth (INDEPENDENT_AMBULATORY_CARE_PROVIDER_SITE_OTHER): Payer: Self-pay | Admitting: Vascular Surgery

## 2024-08-08 NOTE — Telephone Encounter (Signed)
 Pt called and states that he is wanting to come in and just get checked out again. Pt was last seen by JD in 04/26/22. Pt states having no new issues and would like to see Dr. Jama

## 2024-09-16 ENCOUNTER — Ambulatory Visit (INDEPENDENT_AMBULATORY_CARE_PROVIDER_SITE_OTHER): Admitting: Vascular Surgery

## 2024-09-16 ENCOUNTER — Encounter (INDEPENDENT_AMBULATORY_CARE_PROVIDER_SITE_OTHER): Payer: Self-pay

## 2024-09-16 DIAGNOSIS — M79669 Pain in unspecified lower leg: Secondary | ICD-10-CM | POA: Insufficient documentation

## 2024-09-16 NOTE — Progress Notes (Deleted)
 MRN : [id number]  Glenn Hamilton is a 78 y.o. ([birth year]) male who presents with chief complaint of legs swell.  History of Present Illness:   The patient returns to the office for followup evaluation regarding leg swelling.  The swelling has persisted and the pain associated with swelling continues. There have not been any interval development of a ulcerations or wounds.  Since the previous visit the patient has been wearing graduated compression stockings and has noted little if any improvement in the lymphedema. The patient has been using compression routinely morning until night.  The patient also states elevation during the day and exercise is being done too.  A previous venous reflux study dated 04/26/2022 showed no evidence of deep venous thrombosis, superficial thrombophlebitis, or significant venous reflux of the deep or superficial venous systems in either lower extremity.   No outpatient medications have been marked as taking for the 09/16/24 encounter (Appointment) with Jama, Cordella MATSU, MD.    Past Medical History:  Diagnosis Date   Actinic keratosis    Motion sickness    Neuropathy    Restless legs syndrome    Squamous cell carcinoma in situ (SCCIS) of skin     Past Surgical History:  Procedure Laterality Date   ARTHRODESIS     cervicle spine   COLONOSCOPY WITH PROPOFOL  N/A 08/07/2023   Procedure: COLONOSCOPY WITH PROPOFOL ;  Surgeon: Maryruth Ole DASEN, MD;  Location: [facility] ENDOSCOPY;  Service: Endoscopy;  Laterality: N/A;   POLYPECTOMY  08/07/2023   Procedure: POLYPECTOMY;  Surgeon: Maryruth Ole DASEN, MD;  Location: [facility] ENDOSCOPY;  Service: Endoscopy;;   SKIN BIOPSY     SKIN BIOPSY     SPINAL CORD STIMULATOR TRIAL      Social History Social History   Tobacco Use   Smoking status: Never   Smokeless tobacco: Never  Vaping Use   Vaping status: Never Used  Substance Use Topics   Alcohol use: Not Currently   Drug  use: Never    Family History Family History  Problem Relation Age of Onset   Neuropathy Mother    Cancer Father    Cancer Paternal Uncle     No Known Allergies   REVIEW OF SYSTEMS (Negative unless checked)  Constitutional: [] Weight loss  [] Fever  [] Chills Cardiac: [] Chest pain   [] Chest pressure   [] Palpitations   [] Shortness of breath when laying flat   [] Shortness of breath with exertion. Vascular:  [] Pain in legs with walking   [x] Pain in legs with standing  [] History of DVT   [] Phlebitis   [x] Swelling in legs   [] Varicose veins   [] Non-healing ulcers Pulmonary:   [] Uses home oxygen   [] Productive cough   [] Hemoptysis   [] Wheeze  [] COPD   [] Asthma Neurologic:  [] Dizziness   [] Seizures   [] History of stroke   [] History of TIA  [] Aphasia   [] Vissual changes   [] Weakness or numbness in arm   [] Weakness or numbness in leg Musculoskeletal:   [] Joint swelling   [] Joint pain   [] Low back pain Hematologic:  [] Easy bruising  [] Easy bleeding   [] Hypercoagulable state   [] Anemic Gastrointestinal:  [] Diarrhea   [] Vomiting  [] Gastroesophageal reflux/heartburn   [] Difficulty swallowing. Genitourinary:  [] Chronic kidney disease   [] Difficult urination  [] Frequent urination   [] Blood in urine Skin:  [] Rashes   []   Ulcers  Psychological:  [] History of anxiety   []  History of major depression.  Physical Examination  There were no vitals filed for this visit. There is no height or weight on file to calculate BMI. Gen: WD/WN, NAD Head: [place]/AT, No temporalis wasting.  Ear/Nose/Throat: Hearing grossly intact, nares w/o erythema or drainage, pinna without lesions Eyes: PER, EOMI, sclera nonicteric.  Neck: Supple, no gross masses.  No JVD.  Pulmonary:  Good air movement, no audible wheezing, no use of accessory muscles.  Cardiac: RRR, precordium not hyperdynamic. Vascular:  scattered varicosities present bilaterally.  Mild venous stasis changes to the legs bilaterally.  3-4+ soft pitting edema, CEAP  [id number]  Vessel Right Left  Radial Palpable Palpable  Gastrointestinal: soft, non-distended. No guarding/no peritoneal signs.  Musculoskeletal: M/S 5/5 throughout.  No deformity.  Neurologic: CN 2-12 intact. Pain and light touch intact in extremities.  Symmetrical.  Speech is fluent. Motor exam as listed above. Psychiatric: Judgment intact, Mood & affect appropriate for pt's clinical situation. Dermatologic: Venous rashes no ulcers noted.  No changes consistent with cellulitis. Lymph : No lichenification or skin changes of chronic lymphedema.  CBC No results found for: WBC, HGB, HCT, MCV, PLT  BMET No results found for: NA, K, CL, CO2, GLUCOSE, BUN, CREATININE, CALCIUM, GFRNONAA, GFRAA CrCl cannot be calculated (No successful lab value found.).  COAG No results found for: INR, PROTIME  Radiology No results found.   Assessment/Plan There are no diagnoses linked to this encounter.   Cordella Shawl, MD  09/16/2024 10:59 AM

## 2024-10-06 ENCOUNTER — Ambulatory Visit
Admission: EM | Admit: 2024-10-06 | Discharge: 2024-10-06 | Disposition: A | Discharge status: Home / Self Care | Attending: Emergency Medicine | Admitting: Emergency Medicine

## 2024-10-06 ENCOUNTER — Encounter: Payer: Self-pay | Admitting: Emergency Medicine

## 2024-10-06 DIAGNOSIS — J069 Acute upper respiratory infection, unspecified: Secondary | ICD-10-CM

## 2024-10-06 MED ORDER — PROMETHAZINE-DM 6.25-15 MG/5ML PO SYRP: 5.0000 mL | ORAL_SOLUTION | Freq: Every evening | ORAL | 0 refills | Status: AC | PRN

## 2024-10-06 MED ORDER — AMOXICILLIN-POT CLAVULANATE 875-125 MG PO TABS: 1.0000 | ORAL_TABLET | Freq: Two times a day (BID) | ORAL | 0 refills | Status: AC

## 2024-10-06 MED ORDER — BENZONATATE 100 MG PO CAPS: 100.0000 mg | ORAL_CAPSULE | Freq: Three times a day (TID) | ORAL | 0 refills | Status: AC

## 2024-10-06 NOTE — Discharge Instructions (Signed)
 Begin Augmentin  twice a day for 7 days  May use tessalon  every 8 hours as needed, may use cough syrup at bedtime to rest    You can take Tylenol and/or Ibuprofen as needed for fever reduction and pain relief.   For cough: honey 1/2 to 1 teaspoon (you can dilute the honey in water or another fluid).  You can also use guaifenesin and dextromethorphan for cough. You can use a humidifier for chest congestion and cough.  If you don't have a humidifier, you can sit in the bathroom with the hot shower running.      For sore throat: try warm salt water gargles, cepacol lozenges, throat spray, warm tea or water with lemon/honey, popsicles or ice, or OTC cold relief medicine for throat discomfort.   For congestion: take a daily anti-histamine like Zyrtec, Claritin, and a oral decongestant, such as pseudoephedrine.  You can also use Flonase 1-2 sprays in each nostril daily.   It is important to stay hydrated: drink plenty of fluids (water, gatorade/powerade/pedialyte, juices, or teas) to keep your throat moisturized and help further relieve irritation/discomfort.

## 2024-10-06 NOTE — ED Triage Notes (Signed)
 Patient reports nasal congestion,  chest congestion and dry cough 7- 10 days. Patient has been taking mucinex with mild relief. Denies pain.

## 2024-10-06 NOTE — ED Provider Notes (Signed)
 Glenn Hamilton    CSN: [id number] Arrival date & time: 10/06/24  1402      History   Chief Complaint Chief Complaint  Patient presents with   Cough   Nasal Congestion    HPI Glenn Hamilton is a 78 y.o. male.   Patient presents for evaluation of nasal congestion and nonproductive cough present for 10 days.  Cough is persistent interfering with sleep.  Has attempted use of Mucinex.  No known sick contacts prior.  Tolerable to food and liquids.  Denies fever, shortness of breath or wheezing.      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   History of skin cancer 07/03/2014   Diffuse photodamage of skin 06/11/2014   History of actinic keratosis 06/11/2014    Past Surgical History:  Procedure Laterality Date   ARTHRODESIS     cervicle spine   COLONOSCOPY WITH PROPOFOL  N/A 08/07/2023   Procedure: COLONOSCOPY WITH PROPOFOL ;  Surgeon: Maryruth Ole DASEN, MD;  Location: [facility];  Service: Endoscopy;  Laterality: N/A;   POLYPECTOMY  08/07/2023   Procedure: POLYPECTOMY;  Surgeon: Maryruth Ole DASEN, MD;  Location: [facility];  Service: Endoscopy;;   SKIN BIOPSY     SKIN BIOPSY     SPINAL CORD STIMULATOR TRIAL         Home Medications    Prior to Admission medications   Medication Sig Start Date End Date Taking? Authorizing Provider  Alpha-Lipoic Acid 600 MG CAPS Take by mouth 2 (two) times daily. Patient not taking: Reported on 07/31/2023    [provider]  b complex vitamins capsule Take 1 capsule by mouth daily. Patient not taking: Reported on 07/31/2023    [provider]  DULoxetine (CYMBALTA) 30 MG capsule Take 30 mg by mouth daily. Patient not taking: Reported on 07/31/2023    [provider]  rOPINIRole (REQUIP) 1 MG tablet Take 1 tablet by mouth at bedtime. 11/14/06   [provider]  TURMERIC CURCUMIN PO Take by mouth. Patient not taking: Reported on 07/31/2023    [provider]  Vitamin E (VITAMIN E/D-ALPHA NATURAL) 268 MG (400 UNIT) CAPS Take by mouth. Patient not taking: Reported on 07/31/2023    [provider]    Family History Family History  Problem Relation Age of Onset   Neuropathy Mother    Cancer Father    Cancer Paternal Uncle     Social History Social History   Tobacco Use   Smoking status: Never   Smokeless tobacco: Never  Vaping Use   Vaping status: Never Used  Substance Use Topics   Alcohol use: Not Currently   Drug use: Never     Allergies   Patient has no known allergies.   Review of Systems Review of Systems  Constitutional: Negative.   HENT:  Positive for congestion. Negative for dental problem, drooling, ear discharge, ear pain, facial swelling, hearing loss, mouth sores, nosebleeds, postnasal drip,  rhinorrhea, sinus pressure, sinus pain, sneezing, sore throat, tinnitus, trouble swallowing and voice change.   Respiratory:  Positive for cough. Negative for apnea, choking, chest tightness, shortness of breath, wheezing and stridor.   Gastrointestinal: Negative.      Physical Exam Triage Vital Signs ED Triage Vitals  Encounter Vitals Group     BP 10/06/24 1522 110/60     Girls Systolic BP Percentile --      Girls Diastolic BP Percentile --      Boys Systolic BP Percentile --      Boys Diastolic BP Percentile --      Pulse Rate 10/06/24 1522 75     Resp 10/06/24 1522 20     Temp 10/06/24 1522 97.8 F (36.6 C)     Temp Source 10/06/24 1522  Oral     SpO2 10/06/24 1522 96 %     Weight --      Height --      Head Circumference --      Peak Flow --      Pain Score 10/06/24 1525 0     Pain Loc --      Pain Education --      Exclude from Growth Chart --    No data found.  Updated Vital Signs BP 110/60 (BP Location: Left Arm)   Pulse 75   Temp 97.8 F (36.6 C) (Oral)   Resp 20   SpO2 96%   Visual Acuity Right Eye Distance:   Left Eye Distance:   Bilateral Distance:    Right Eye Near:   Left Eye Near:    Bilateral Near:     Physical Exam Constitutional:      Appearance: Normal appearance.  HENT:     Right Ear: Tympanic membrane, ear canal and external ear normal.     Left Ear: Tympanic membrane, ear canal and external ear normal.     Nose: Congestion present.     Mouth/Throat:     Pharynx: No oropharyngeal exudate or posterior oropharyngeal erythema.  Cardiovascular:     Rate and Rhythm: Normal rate and regular rhythm.     Pulses: Normal pulses.     Heart sounds: Normal heart sounds.  Pulmonary:     Effort: Pulmonary effort is normal.     Breath sounds: Normal breath sounds.  Musculoskeletal:     Cervical back: Normal range of motion and neck supple.  Neurological:     Mental Status: He is alert and oriented to person, place, and time. Mental status is at baseline.      UC Treatments / Results  Labs (all labs ordered are listed, but only abnormal results are displayed) Labs Reviewed - No data to display  EKG   Radiology No results found.  Procedures Procedures (including critical care time)  Medications Ordered in UC Medications - No data to display  Initial Impression / Assessment and Plan / UC Course  I have reviewed the triage vital signs and the nursing notes.  Pertinent labs & imaging results that were available during my care of the patient were reviewed by me and considered in my medical decision making (see chart for details).  Acute URI  Patient is in no signs of distress  nor toxic appearing.  Vital signs are stable.  Low suspicion for pneumonia, pneumothorax or bronchitis and therefore will defer imaging.  Viral testing deferred due to timeline.  Prescribed Augmentin  and Promethazine  DM and Tessalon .May use additional over-the-counter medications as needed for  supportive care.  May follow-up with urgent care as needed if symptoms persist or worsen.   Final Clinical Impressions(s) / UC Diagnoses   Final diagnoses:  None   Discharge Instructions   None    ED Prescriptions   None    PDMP not reviewed this encounter.   Teresa Shelba SAUNDERS, [place] 10/09/24 [phone]

## 2024-10-17 ENCOUNTER — Ambulatory Visit (INDEPENDENT_AMBULATORY_CARE_PROVIDER_SITE_OTHER): Admitting: Vascular Surgery

## 2024-10-24 NOTE — Progress Notes (Unsigned)
 MRN : [id number]  Glenn Hamilton Glenn Hamilton is a 78 y.o. (09-15-1946) male who presents with chief complaint of legs swell.  History of Present Illness:   The patient returns to the office for followup evaluation regarding leg swelling.  The swelling has persisted and the pain of his legs that seems associated with swelling continues.  He has an extensive history of neuropathy which has been quite severe and seems to worsen as the day goes on.  He also experiences leg pain at night.  He does have a significant history of degenerative spine disease although this has primarily been in the cervical region.  He follows with the [facility].  There have not been any interval development of a ulcerations or wounds.  Since the previous visit the patient has never been able to wear graduated compression stockings secondary to his neuropathy it is just too painful.  The patient has been using compression routinely in the morning but at night he is unable to use compression secondary to the worsening neuropathic symptoms.   The patient also states elevation during the day and exercise is being done too.  Active Medications[1]  Past Medical History:  Diagnosis Date   Actinic keratosis    Motion sickness    Neuropathy    Restless legs syndrome    Squamous cell carcinoma in situ (SCCIS) of skin     Past Surgical History:  Procedure Laterality Date   ARTHRODESIS     cervicle spine   COLONOSCOPY WITH PROPOFOL  N/A 08/07/2023   Procedure: COLONOSCOPY WITH PROPOFOL ;  Surgeon: Maryruth Ole DASEN, MD;  Location: [facility];  Service: Endoscopy;  Laterality: N/A;   POLYPECTOMY  08/07/2023   Procedure: POLYPECTOMY;  Surgeon: Maryruth Ole DASEN, MD;  Location: [facility];  Service: Endoscopy;;   SKIN BIOPSY     SKIN BIOPSY     SPINAL CORD STIMULATOR TRIAL      Social History Social History[2]  Family History Family History  Problem Relation Age of Onset    Neuropathy Mother    Cancer Father    Cancer Paternal Uncle     Allergies[3]   REVIEW OF SYSTEMS (Negative unless checked)  Constitutional: [] Weight loss  [] Fever  [] Chills Cardiac: [] Chest pain   [] Chest pressure   [] Palpitations   [] Shortness of breath when laying flat   [] Shortness of breath with exertion. Vascular:  [] Pain in legs with walking   [x] Pain in legs with standing  [] History of DVT   [] Phlebitis   [x] Swelling in legs   [] Varicose veins   [] Non-healing ulcers Pulmonary:   [] Uses home oxygen   [] Productive cough   [] Hemoptysis   [] Wheeze  [] COPD   [] Asthma Neurologic:  [] Dizziness   [] Seizures   [] History of stroke   [] History of TIA  [] Aphasia   [] Vissual changes   [] Weakness or numbness in arm   [] Weakness or numbness in leg Musculoskeletal:   [] Joint swelling   [] Joint pain   [] Low back pain Hematologic:  [] Easy bruising  [] Easy bleeding   [] Hypercoagulable state   [] Anemic Gastrointestinal:  [] Diarrhea   [] Vomiting  [] Gastroesophageal reflux/heartburn   [] Difficulty swallowing. Genitourinary:  [] Chronic kidney disease   [] Difficult urination  [] Frequent urination   [] Blood in urine Skin:  [] Rashes   [] Ulcers  Psychological:  [] History of anxiety   []   History of major depression.  Physical Examination  There were no vitals filed for this visit. There is no height or weight on file to calculate BMI. Gen: WD/WN, NAD Head: Bright/AT, No temporalis wasting.  Ear/Nose/Throat: Hearing grossly intact, nares w/o erythema or drainage, pinna without lesions Eyes: PER, EOMI, sclera nonicteric.  Neck: Supple, no gross masses.  No JVD.  Pulmonary:  Good air movement, no audible wheezing, no use of accessory muscles.  Cardiac: RRR, precordium not hyperdynamic. Vascular:  scattered varicosities present bilaterally.  Moderate venous stasis changes to the legs bilaterally.  2+ soft pitting edema, CEAP [id number]  Vessel Right Left  Radial Palpable Palpable  Gastrointestinal: soft,  non-distended. No guarding/no peritoneal signs.  Musculoskeletal: M/S 5/5 throughout.  No deformity.  Neurologic: CN 2-12 intact. Pain and light touch intact in extremities.  Symmetrical.  Speech is fluent. Motor exam as listed above. Psychiatric: Judgment intact, Mood & affect appropriate for pt's clinical situation. Dermatologic: Venous rashes no ulcers noted.  No changes consistent with cellulitis. Lymph : No lichenification or skin changes of chronic lymphedema.  CBC No results found for: WBC, HGB, HCT, MCV, PLT  BMET No results found for: NA, K, CL, CO2, GLUCOSE, BUN, CREATININE, CALCIUM, GFRNONAA, GFRAA CrCl cannot be calculated (No successful lab value found.).  COAG No results found for: INR, PROTIME  Radiology No results found.   Assessment/Plan 1. Pain and swelling of lower leg, unspecified laterality Recommend:  The patient has atypical pain symptoms for vascular disease and on exam I do not find evidence of vascular pathology that would explain the patient's symptoms.  Noninvasive studies do not identify significant vascular problems  I suspect the patient is c/o worsening of his neuropathy secondary to his degenerative spinal disease.  Patient should have an evaluation of the LS spine which I defer to the pain management service.  I will place an ambulatory referral  The patient should continue his exercise program. The patient should continue his antiplatelet therapy and aggressive treatment of the lipid abnormalities as needed.  - Ambulatory referral to Pain Clinic  2. Lymphedema (Primary) Recommend:  No surgery or intervention at this point in time.    I have reviewed my discussion with the patient regarding lymphedema and why it  causes symptoms.  Patient will continue wearing graduated compression on a daily basis. The patient should put the compression on first thing in the morning and removing them in the evening. The  patient should not sleep in the compression.   In addition, behavioral modification throughout the day will be continued.  This will include frequent elevation (such as in a recliner), use of over the counter pain medications as needed and exercise such as walking.  The systemic causes for chronic edema such as liver, kidney and cardiac etiologies does not appear to have significant changed over the past year.    The patient will continue aggressive use of the  lymph pump.  This will continue to improve the edema control and prevent sequela such as ulcers and infections.   The patient will follow-up with me on an annual basis.   3. Neuropathy Recommend:  The patient has atypical pain symptoms for vascular disease and on exam I do not find evidence of vascular pathology that would explain the patient's symptoms.  Noninvasive studies do not identify significant vascular problems  I suspect the patient is c/o worsening of his neuropathy secondary to his degenerative spinal disease.  Patient should have an evaluation of the LS  spine which I defer to the pain management service.  I will place an ambulatory referral  The patient should continue his exercise program. The patient should continue his antiplatelet therapy and aggressive treatment of the lipid abnormalities as needed. - Ambulatory referral to Pain Clinic    Cordella Shawl, MD  10/24/2024 12:46 PM      [1]  No outpatient medications have been marked as taking for the 10/28/24 encounter (Appointment) with Shawl, Cordella MATSU, MD.  [2]  Social History Tobacco Use   Smoking status: Never   Smokeless tobacco: Never  Vaping Use   Vaping status: Never Used  Substance Use Topics   Alcohol use: Not Currently   Drug use: Never  [3] No Known Allergies

## 2024-10-28 ENCOUNTER — Ambulatory Visit (INDEPENDENT_AMBULATORY_CARE_PROVIDER_SITE_OTHER): Admitting: Vascular Surgery

## 2024-10-28 ENCOUNTER — Encounter (INDEPENDENT_AMBULATORY_CARE_PROVIDER_SITE_OTHER): Payer: Self-pay | Admitting: Vascular Surgery

## 2024-10-28 VITALS — BP 93/62 | HR 87 | Resp 18 | Ht 69.0 in | Wt 204.8 lb

## 2024-10-28 DIAGNOSIS — G629 Polyneuropathy, unspecified: Secondary | ICD-10-CM | POA: Insufficient documentation

## 2024-10-28 DIAGNOSIS — M79669 Pain in unspecified lower leg: Secondary | ICD-10-CM | POA: Diagnosis not present

## 2024-10-28 DIAGNOSIS — I890 Lymphedema, not elsewhere classified: Secondary | ICD-10-CM

## 2024-10-28 DIAGNOSIS — M7989 Other specified soft tissue disorders: Secondary | ICD-10-CM | POA: Diagnosis not present

## 2024-12-11 ENCOUNTER — Ambulatory Visit
Attending: Student in an Organized Health Care Education/Training Program | Admitting: Student in an Organized Health Care Education/Training Program

## 2024-12-11 ENCOUNTER — Encounter: Payer: Self-pay | Admitting: Student in an Organized Health Care Education/Training Program

## 2024-12-11 VITALS — BP 133/70 | HR 66 | Temp 97.5°F | Resp 16 | Ht 65.0 in | Wt 205.0 lb

## 2024-12-11 DIAGNOSIS — G894 Chronic pain syndrome: Secondary | ICD-10-CM | POA: Diagnosis not present

## 2024-12-11 DIAGNOSIS — R202 Paresthesia of skin: Secondary | ICD-10-CM | POA: Insufficient documentation

## 2024-12-11 DIAGNOSIS — G5793 Unspecified mononeuropathy of bilateral lower limbs: Secondary | ICD-10-CM | POA: Insufficient documentation

## 2024-12-11 NOTE — Progress Notes (Signed)
 PROVIDER NOTE: Interpretation of information contained herein should be left to medically-trained personnel. Specific patient instructions are provided elsewhere under Patient Instructions section of medical record. This document was created in part using AI and STT-dictation technology, any transcriptional errors that may result from this process are unintentional.  Patient: Glenn Hamilton  Service: E/M Encounter  Provider: Wallie Sherry, MD  DOB: 1945/11/17  Delivery: Face-to-face  Specialty: Interventional Pain Management  MRN: [id number]  Setting: Ambulatory outpatient facility  Specialty designation: 09  Type: New Patient  Location: Outpatient office facility  PCP: Glover Lenis, MD  DOS: 12/11/2024    Referring Prov.: Schnier, Cordella MATSU, MD   Primary Reason(s) for Visit: Encounter for initial evaluation of one or more chronic problems (new to examiner) potentially causing chronic pain, and posing a threat to normal musculoskeletal function. (Level of risk: High) CC: Foot Pain (bilateral)  HPI  Mr. Glenn Hamilton is a 79 y.o. year old, male patient, who comes for the first time to our practice referred by Jama, Cordella MATSU, MD for our initial evaluation of his chronic pain. He has Arthrodesis status; Cervical stenosis of spine; Diffuse photodamage of skin; Hereditary and idiopathic neuropathy, unspecified; History of actinic keratosis; History of skin cancer; Restless legs syndrome; Swollen ankles; Swelling of limb; Lymphedema; Pain and swelling of lower leg; Neuropathy; Neuropathy involving both lower extremities; Paresthesia of lower extremity; and Chronic pain syndrome on their problem list. Today he comes in for evaluation of his Foot Pain (bilateral)  Pain Assessment: Location: Right, Left Foot Radiating: both legs Onset: More than a month ago Duration: Neuropathic pain Quality: Burning, Constant Severity: 8 /10 (subjective, self-reported pain score)  Effect on ADL:   Timing:  Constant Modifying factors: cold/ice packs, sitting or lying down BP: 133/70  HR: 66  Onset and Duration: Gradual Cause of pain: 06/2016 Severity: NAS-11 at its worse: 9/10, NAS-11 at its best: 9/10, NAS-11 now: 6/10, and NAS-11 on the average: 9/10 Timing: Afternoon, Night, During activity or exercise, and After activity or exercise Aggravating Factors: Bending, Climbing, Kneeling, Lifiting, Motion, Prolonged standing, and Walking Alleviating Factors: Cold packs, Lying down, Resting, and Sleeping Associated Problems: Fatigue, Swelling, and Weakness Quality of Pain: Burning, Constant, Hot, Punishing, Tiring, and Uncomfortable Previous Examinations or Tests: CT scan, EMG/PNCV, MRI scan, Nerve block, Nerve conduction test, and Neurological evaluation Previous Treatments: Biofeedback, Spinal cord stimulator, and TENS  Mr. Glenn Hamilton is being evaluated for possible interventional pain management therapies for the treatment of his chronic pain.   Discussed the use of AI scribe software for clinical note transcription with the patient, who gave verbal consent to proceed.  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  He has been experiencing chronic burning pain in his feet and legs for the past eight years, diagnosed as idiopathic peripheral neuropathy. The burning sensation is constant, worsening throughout the day and peaking at night. Physical activity exacerbates the pain, preventing him from engaging in activities he once enjoyed, such as biking and walking. He describes the sensation as 'the fire gets hotter, hotter.'  He has tried numerous treatments over the years, including pharmaceuticals: Gabapentin, Lyrica, SNRIs, TCAs and topical creams, but none have provided relief. He specifically mentions trying capsaicin cream, which caused significant burning,  leading him to wash it off quickly. He wears Crocs as he cannot tolerate socks due  to the burning sensation.  He has no history of diabetes, which was confirmed during the initial diagnosis of idiopathic peripheral neuropathy. He participated in a spinal cord stimulator trial at [facility], which was unsuccessful in providing relief, as he was among the 20% of participants who did not benefit.  In addition to neuropathy, he has lymphedema and uses compression sleeves daily for one hour to manage swelling, though this does not alleviate the burning pain. No heart issues and had an EKG six months ago for cataract surgery clearance.       Meds  Current Medications[1]  Imaging Review   MR THORACIC SPINE WO CONTRAST  Narrative CLINICAL DATA:  Burning sensation in both legs, pre spinal stimulator imaging  EXAM: MRI THORACIC SPINE WITHOUT CONTRAST  TECHNIQUE: Multiplanar, multisequence MR imaging of the thoracic spine was performed. No intravenous contrast was administered.  COMPARISON:  None Available.  FINDINGS: Alignment: Thoracic spine alignment is normal. There is transitional lumbosacral anatomy with sacralization of the L5 vertebral body seen on the scout sequence. There is grade 1 anterolisthesis of L2 on L3.  Vertebrae: Postsurgical changes are noted in the cervical spine on the count sequence, incompletely evaluated. Thoracic vertebral body heights are preserved. Background marrow signal is normal. 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 There is no suspicious marrow signal abnormality in the spine. There is trace degenerative endplate marrow signal abnormality with faint edema at T9-T10. There is no other marrow edema.  There is a 1.7 cm T2/STIR hyperintense lesion in the sternal manubrium with corresponding mild T1 hypointensity.  Cord: The lower cervical cord appears atrophic with decreased AP diameter. The thoracic  cord is normal in signal and morphology.  Paraspinal and other soft tissues: Unremarkable.  Disc levels:  There is mild multilevel degenerative endplate change and facet arthropathy. There is disc desiccation and narrowing most advanced at T7-T8 through T9-T10.  There is a mild disc bulge at C7-T1 without significant spinal canal or neural foraminal stenosis  There are small disc protrusions at T7-T8 and T8-T9 without significant spinal.  There is a mild bulge at T9-T10 extending into the left neural foramen, facet arthropathy, and ligamentum flavum thickening resulting in mild spinal canal stenosis and severe left neural foraminal stenosis.  There is no other significant spinal canal or neural foraminal stenosis.  IMPRESSION: 1. Disc bulge at T9-T10 extending into the left neural foramen and facet arthropathy resulting in severe left neural foraminal stenosis and mild spinal canal stenosis. 2. Otherwise, mild degenerative changes throughout the remainder of the thoracic spine without other significant spinal canal or neural foraminal stenosis. 3. T2 hyperintense, T1 hypointense lesion in the sternal manubrium. 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 Correlation with patient's prior CT chest from 04/30/2018 (not available for direct comparison at the time of dictation) for typical CT features of an intraosseous hemangioma would be useful to establish stability. Alternatively, consider follow-up MRI with contrast in 4-6 months. 4. Apparent atrophy of the lower cervical cord, incompletely imaged.   Electronically Signed By: Maude Harry M.D. On: 03/18/2022 12:39    Complexity Note: Imaging results reviewed.                         ROS  Cardiovascular: No reported cardiovascular signs or symptoms  such as  High blood pressure, coronary artery disease, abnormal heart rate or rhythm, heart attack, blood thinner therapy or heart weakness and/or failure Pulmonary or Respiratory: Snoring  Neurological: Abnormal skin sensations (Peripheral Neuropathy) Psychological-Psychiatric: No reported psychological or psychiatric signs or symptoms such as difficulty sleeping, anxiety, depression, delusions or hallucinations (schizophrenial), mood swings (bipolar disorders) or suicidal ideations or attempts Gastrointestinal: No reported gastrointestinal signs or symptoms such as vomiting or evacuating blood, reflux, heartburn, alternating episodes of diarrhea and constipation, inflamed or scarred liver, or pancreas or irrregular and/or infrequent bowel movements Genitourinary: No reported renal or genitourinary signs or symptoms such as difficulty voiding or producing urine, peeing blood, non-functioning kidney, kidney stones, difficulty emptying the bladder, difficulty controlling the flow of urine, or chronic kidney disease Hematological: No reported hematological signs or symptoms such as prolonged bleeding, low or poor functioning platelets, bruising or bleeding easily, hereditary bleeding problems, low energy levels due to low hemoglobin or being anemic Endocrine: No reported endocrine signs or symptoms such as high or low blood sugar, rapid heart rate due to high thyroid levels, obesity or weight gain due to slow thyroid or thyroid disease Rheumatologic: No reported rheumatological signs and symptoms such as fatigue, joint pain, tenderness, swelling, redness, heat, stiffness, decreased range of motion, with or without associated rash Musculoskeletal: Negative for myasthenia gravis, muscular dystrophy, multiple sclerosis or malignant hyperthermia Work History: Retired  Allergies  Mr. Glenn Hamilton has no known allergies.  Laboratory Chemistry Profile   Renal No results found for: BUN, CREATININE, LABCREA, BCR,  GFR, GFRAA, GFRNONAA, SPECGRAV, PHUR, PROTEINUR   Electrolytes No results found for: NA, K, CL, CALCIUM, MG, PHOS   Hepatic No results found for: AST, ALT, ALBUMIN, ALKPHOS, AMYLASE, LIPASE, AMMONIA   ID No results found for: LYMEIGGIGMAB, HIV, SARSCOV2NAA, STAPHAUREUS, MRSAPCR, HCVAB, PREGTESTUR, RMSFIGG, QFVRPH1IGG, QFVRPH2IGG   Bone No results found for: VD25OH, [id number], CI6874NY7, CI7874NY7, 25OHVITD1, 25OHVITD2, 25OHVITD3, TESTOFREE, TESTOSTERONE   Endocrine No results found for: GLUCOSE, GLUCOSEU, HGBA1C, TSH, FREET4, TESTOFREE, TESTOSTERONE, SHBG, ESTRADIOL, ESTRADIOLPCT, ESTRADIOLFRE, LABPREG, ACTH, CRTSLPL, UCORFRPERLTR, UCORFRPERDAY, CORTISOLBASE   Neuropathy No results found for: VITAMINB12, FOLATE, HGBA1C, HIV   CNS No results found for: COLORCSF, APPEARCSF, RBCCOUNTCSF, WBCCSF, POLYSCSF, LYMPHSCSF, EOSCSF, PROTEINCSF, GLUCCSF, JCVIRUS, CSFOLI, IGGCSF, LABACHR, ACETBL   Inflammation (CRP: Acute  ESR: Chronic) No results found for: CRP, ESRSEDRATE, LATICACIDVEN   Rheumatology No results found for: RF, ANA, LABURIC, URICUR, LYMEIGGIGMAB, LYMEABIGMQN, HLAB27   Coagulation No results found for: INR, LABPROT, APTT, PLT, DDIMER, LABHEMA, VITAMINK1, AT3   Cardiovascular No results found for: BNP, CKTOTAL, CKMB, TROPONINI, HGB, HCT, LABVMA, EPIRU, EPINEPH24HUR, NOREPRU, NOREPI24HUR, DOPARU, DOPAM24HRUR   Screening No results found for: SARSCOV2NAA, COVIDSOURCE, STAPHAUREUS, MRSAPCR, HCVAB, HIV, PREGTESTUR   Cancer No results found for: CEA, CA125, LABCA2   Allergens No results found for: ALMOND, APPLE, ASPARAGUS, AVOCADO, BANANA, BARLEY, BASIL, BAYLEAF, GREENBEAN, LIMABEAN, WHITEBEAN, BEEFIGE, REDBEET, BLUEBERRY,  BROCCOLI, CABBAGE, MELON, CARROT, CASEIN, CASHEWNUT, CAULIFLOWER, CELERY     Note: Lab results reviewed.  PFSH  Drug: Mr. Glenn Hamilton  reports no history of drug use. Alcohol:  reports that he does not currently use alcohol. Tobacco:  reports that he has never smoked. He has never used smokeless tobacco. Medical:  has a past medical history of Actinic keratosis, Motion sickness, Neuropathy, Restless legs syndrome, and Squamous cell carcinoma in situ (SCCIS) of skin. Family: family history includes Cancer in his father and paternal uncle; Neuropathy in his mother.  Past Surgical History:  Procedure Laterality Date  ARTHRODESIS     cervicle spine   COLONOSCOPY WITH PROPOFOL  N/A 08/07/2023   Procedure: COLONOSCOPY WITH PROPOFOL ;  Surgeon: Maryruth Ole DASEN, MD;  Location: [facility] ENDOSCOPY;  Service: Endoscopy;  Laterality: N/A;   POLYPECTOMY  08/07/2023   Procedure: POLYPECTOMY;  Surgeon: Maryruth Ole DASEN, MD;  Location: [facility] ENDOSCOPY;  Service: Endoscopy;;   SKIN BIOPSY     SKIN BIOPSY     SPINAL CORD STIMULATOR TRIAL     Active Ambulatory Problems    Diagnosis Date Noted   Arthrodesis status 05/18/2017   Cervical stenosis of spine 05/18/2017   Diffuse photodamage of skin 06/11/2014   Hereditary and idiopathic neuropathy, unspecified 10/14/2016   History of actinic keratosis 06/11/2014   History of skin cancer 07/03/2014   Restless legs syndrome 03/25/2022   Swollen ankles 11/14/2017   Swelling of limb 03/25/2022   Lymphedema 04/26/2022   Pain and swelling of lower leg 09/16/2024   Neuropathy 10/28/2024   Neuropathy involving both lower extremities 12/11/2024   Paresthesia of lower extremity 12/11/2024   Chronic pain syndrome 12/11/2024   Resolved Ambulatory Problems    Diagnosis Date Noted   No Resolved Ambulatory Problems   Past Medical History:  Diagnosis Date   Actinic keratosis    Motion sickness    Squamous cell carcinoma in situ (SCCIS) of skin     Constitutional Exam  General appearance: Well nourished, well developed, and well hydrated. In no apparent acute distress Vitals:   12/11/24 1356  BP: 133/70  Pulse: 66  Resp: 16  Temp: (!) 97.5 F (36.4 C)  TempSrc: Temporal  SpO2: 98%  Weight: 205 lb (93 kg)  Height: 5' 5 (1.651 m)   BMI Assessment: Estimated body mass index is 34.11 kg/m as calculated from the following:   Height as of this encounter: 5' 5 (1.651 m).   Weight as of this encounter: 205 lb (93 kg).  BMI interpretation table: BMI level Category Range association with higher incidence of chronic pain  <18 kg/m2 Underweight   18.5-24.9 kg/m2 Ideal body weight   25-29.9 kg/m2 Overweight Increased incidence by 20%  30-34.9 kg/m2 Obese (Class I) Increased incidence by 68%  35-39.9 kg/m2 Severe obesity (Class II) Increased incidence by 136%  >40 kg/m2 Extreme obesity (Class III) Increased incidence by 254%   Patient's current BMI Ideal Body weight  Body mass index is 34.11 kg/m. Ideal body weight: 61.5 kg (135 lb 9.3 oz) Adjusted ideal body weight: 74.1 kg (163 lb 5.6 oz)   BMI Readings from Last 4 Encounters:  12/11/24 34.11 kg/m  10/28/24 30.24 kg/m  08/07/23 28.78 kg/m  04/26/22 28.41 kg/m   Wt Readings from Last 4 Encounters:  12/11/24 205 lb (93 kg)  10/28/24 204 lb 12.8 oz (92.9 kg)  08/07/23 200 lb 9.6 oz (91 kg)  04/26/22 198 lb (89.8 kg)    Psych/Mental status: Alert, oriented x 3 (person, place, & time)       Eyes: PERLA Respiratory: No evidence of acute respiratory distress  Bilateral feet paresthesias  Assessment  Primary Diagnosis & Pertinent Problem List: The primary encounter diagnosis was Neuropathy involving both lower extremities. Diagnoses of Paresthesia of lower extremity and Chronic pain syndrome were also pertinent to this visit.  Visit Diagnosis (New problems to examiner): 1. Neuropathy involving both lower extremities   2. Paresthesia of lower extremity   3.  Chronic pain syndrome    Plan of Care (Initial workup plan)  Assessment and Plan    Idiopathic  peripheral neuropathy with chronic pain   He experiences chronic idiopathic peripheral neuropathy with burning pain in his feet and legs, worsening throughout the day and exacerbated by activity. Previous treatments, including pharmaceuticals and capsaicin cream, have been ineffective. Qutenza (8% capsaicin patch) is considered as a potential treatment, though it is FDA approved for diabetic neuropathy, and insurance coverage is uncertain. Lidocaine  infusion is another potential option if Qutenza is not approved, pending EKG results to ensure safety. He has not experienced relief from previous spinal cord stimulator trial.  Attempt to obtain insurance approval for Qutenza application every three months. If Qutenza is denied, consider lidocaine  infusion pending EKG results to ensure QTC interval is less than 450 ms.        Procedure Orders         NEUROLYSIS        Provider-requested follow-up: Return in about 2 weeks (around 12/25/2024) for Qutenza #1.  No future appointments.  I discussed the assessment and treatment plan with the patient. The patient was provided an opportunity to ask questions and all were answered. The patient agreed with the plan and demonstrated an understanding of the instructions.  Patient advised to call back or seek an in-person evaluation if the symptoms or condition worsens. I personally spent a total of 40 minutes in the care of the patient today including preparing to see the patient, getting/reviewing separately obtained history, performing a medically appropriate exam/evaluation, counseling and educating, placing orders, and documenting clinical information in the EHR.   Note by: Wallie Sherry, MD (TTS and AI technology used. I apologize for any typographical errors that were not detected and corrected.) Date: 12/11/2024; Time: 2:40 PM     [1]  Current  Outpatient Medications:    rOPINIRole (REQUIP) 1 MG tablet, Take 1 tablet by mouth at bedtime., Disp: , Rfl:    Alpha-Lipoic Acid 600 MG CAPS, Take by mouth 2 (two) times daily., Disp: , Rfl:    amoxicillin -clavulanate (AUGMENTIN ) 875-125 MG tablet, Take 1 tablet by mouth every 12 (twelve) hours., Disp: 14 tablet, Rfl: 0   b complex vitamins capsule, Take 1 capsule by mouth daily. (Patient not taking: Reported on 10/28/2024), Disp: , Rfl:    benzonatate  (TESSALON ) 100 MG capsule, Take 1 capsule (100 mg total) by mouth every 8 (eight) hours., Disp: 21 capsule, Rfl: 0   DULoxetine (CYMBALTA) 30 MG capsule, Take 30 mg by mouth daily., Disp: , Rfl:    promethazine -dextromethorphan (PROMETHAZINE -DM) 6.25-15 MG/5ML syrup, Take 5 mLs by mouth at bedtime as needed., Disp: 118 mL, Rfl: 0   TURMERIC CURCUMIN PO, Take by mouth., Disp: , Rfl:    Vitamin E (VITAMIN E/D-ALPHA NATURAL) 268 MG (400 UNIT) CAPS, Take by mouth. (Patient not taking: Reported on 10/28/2024), Disp: , Rfl:

## 2024-12-11 NOTE — Patient Instructions (Signed)
 Qutenza (if denied then Lidocaine  infusion, patient will call to schedule does not need prior auth9 Lidocaine  infusion, patient will need to get us  his EKG before

## 2024-12-25 ENCOUNTER — Ambulatory Visit: Admitting: Student in an Organized Health Care Education/Training Program
